# Patient Record
Sex: Male | Born: 1965 | Race: White | Hispanic: No | Marital: Married | Smoking: Never smoker
Health system: Southern US, Community
[De-identification: ages and names within clinical notes are randomized; demographics above are authoritative.]

---

## 2004-07-18 ENCOUNTER — Ambulatory Visit: Payer: Self-pay

## 2004-11-27 ENCOUNTER — Ambulatory Visit: Payer: Self-pay

## 2005-05-20 IMAGING — NM NUCLEAR MEDICINE WHOLE BODY BONE SCINTIGRAPHY
2 series · 8 of 8 positions shown · non-contrast
Comparison: none

REASON FOR EXAM: joint pain  Dr. Luky Iliya [REDACTED]  Yemoh Ablade ...
COMMENTS:

[Series 1: 3 hr wholebody · 2.40mm/px · 2 of 2 frames shown]
[frame 1/2]
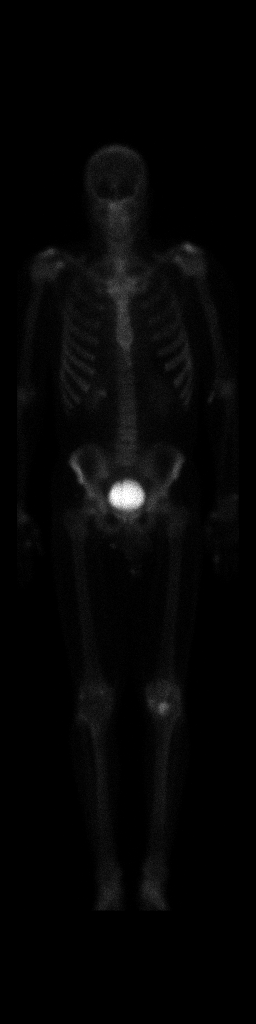
[frame 2/2]
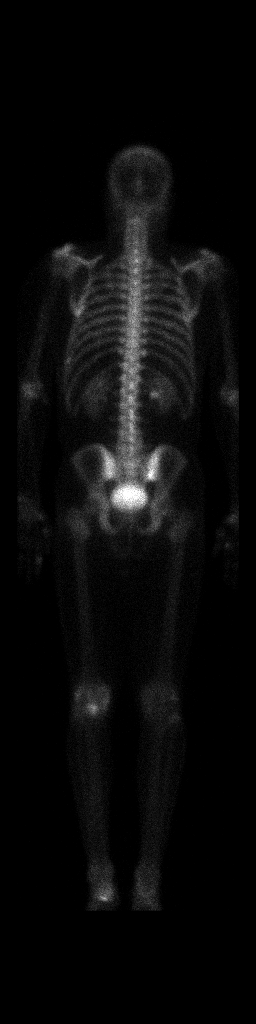

[Series 1: bone statics · 2.40mm/px · 3 acquisitions, 6 frames shown]
[im 1/3]
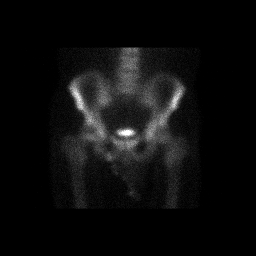
[im 1/3]
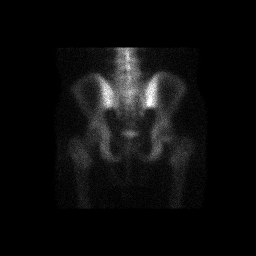
[im 2/3]
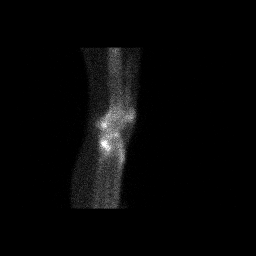
[im 2/3]
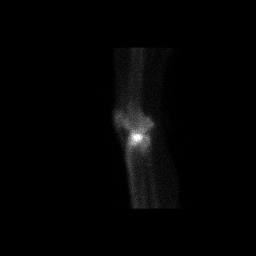
[im 3/3]
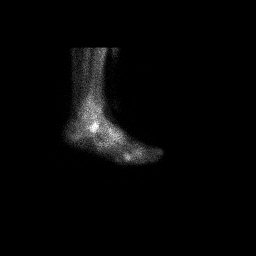
[im 3/3]
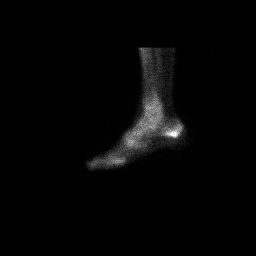

[8 of 8 positions shown; findings below may reference images not displayed]

PROCEDURE:     NM  - NM BONE WB 3 HR [DATE]  [DATE]

RESULT:     The patient was injected with 22.32 mCi of technetium-55m HDP.
Bone scan was performed with anterior and posterior total body views along
with spot views of the pelvis, knees, feet and hands.  There is generalized
increased activity identified within both knees slightly worse on the LEFT
than the RIGHT throughout both feet particularly at the LEFT heel and in
both hands.  The findings of increased activity in the knees and feet are
most likely osteoarthritic in nature though I cannot exclude the possibility
of a stress fracture in the calcaneus.  The activity level in the hands
suggests osteoarthritis as well, but if the patient has a recent history of
trauma this could represent reflect sympathetic dystrophy. I do not see
evidence of suspicious focal intense activity to suspect metastatic disease.
 No suspicious activity is identified within the skull, ribs, vertebral
bodies or pelvis.  The kidneys and bladder appear to be functioning
normally.
IMPRESSION: 1)Diffuse activity is seen throughout both hands. The pattern is suggestive
of osteoarthritis, but if the patient has had recent history of trauma I
cannot exclude the possibility of reflex sympathetic dystrophy.

2)Increased activity in the LEFT knee suggesting arthritis though I again I
cannot exclude the possibility of insufficiency or stress fracture.

3)Diffuse increased activity in both feet that also suggests arthritis
though there is intense activity in the LEFT heel and this may represent an
insufficiency fracture.

4)No suspicious abnormal activity is noted within the skull, ribs, or
vertebral bodies.

## 2016-05-21 ENCOUNTER — Ambulatory Visit
Admission: EM | Admit: 2016-05-21 | Discharge: 2016-05-21 | Disposition: A | Discharge status: Home / Self Care | Payer: BLUE CROSS/BLUE SHIELD | Attending: Family Medicine | Admitting: Family Medicine

## 2016-05-21 DIAGNOSIS — B029 Zoster without complications: Secondary | ICD-10-CM | POA: Diagnosis not present

## 2016-05-21 MED ORDER — VALACYCLOVIR HCL 1 G PO TABS: 1000.0000 mg | ORAL_TABLET | Freq: Three times a day (TID) | ORAL | 0 refills | Status: AC

## 2016-05-21 NOTE — ED Triage Notes (Signed)
Pt c/o rash on his right under arm and back. Not painful and not itchy it appeared on Saturday.

## 2016-05-21 NOTE — ED Provider Notes (Signed)
[facility]    CSN: [id number] Arrival date & time: 05/21/16  1618     History   Chief Complaint Chief Complaint  Patient presents with  . Rash    HPI Brian Mendoza is a 50 y.o. male.   50 yo male with a c/o rash on chest, back and arm for the past 4 days. States rash appeared suddenly and is associated with "tingling" and mild discomfort. Denies any fevers, chills, shortness of breath,  drainage, plant, chemical  or animal contact, medications.   The history is provided by the patient.  Rash    History reviewed. No pertinent past medical history.  There are no active problems to display for this patient.   History reviewed. No pertinent surgical history.     Home Medications    Prior to Admission medications   Medication Sig Start Date End Date Taking? Authorizing Provider  valACYclovir (VALTREX) 1000 MG tablet Take 1 tablet (1,000 mg total) by mouth 3 (three) times daily. 05/21/16   Payton Mccallumrlando [place] Beckford, MD    Family History History reviewed. No pertinent family history.  Social History Social History  Substance Use Topics  . Smoking status: Never Smoker  . Smokeless tobacco: Never Used  . Alcohol use No     Allergies   Patient has no known allergies.   Review of Systems Review of Systems  Skin: Positive for rash.     Physical Exam Triage Vital Signs ED Triage Vitals  Enc Vitals Group     BP 05/21/16 1735 (!) 139/95     Pulse Rate 05/21/16 1735 69     Resp 05/21/16 1735 18     Temp 05/21/16 1735 98.2 F (36.8 C)     Temp Source 05/21/16 1735 Oral     SpO2 05/21/16 1735 100 %     Weight 05/21/16 1733 175 lb (79.4 kg)     Height 05/21/16 1733 6\' 1"  (1.854 m)     Head Circumference --      Peak Flow --      Pain Score 05/21/16 1734 0     Pain Loc --      Pain Edu? --      Excl. in GC? --    No data found.   Updated Vital Signs BP (!) 139/95 (BP Location: Left Arm)   Pulse 69   Temp 98.2 F (36.8 C) (Oral)   Resp 18    Ht 6\' 1"  (1.854 m)   Wt 175 lb (79.4 kg)   SpO2 100%   BMI 23.09 kg/m   Visual Acuity Right Eye Distance:   Left Eye Distance:   Bilateral Distance:    Right Eye Near:   Left Eye Near:    Bilateral Near:     Physical Exam  Constitutional: He appears well-developed and well-nourished. No distress.  Skin: He is not diaphoretic.  Erythematous, vesicular rash along dermatomal pattern along anterior and right side chest wall as well as right mid back area; 3 discreet, aligned lesions as well on right inner upper arm  Nursing note and vitals reviewed.    UC Treatments / Results  Labs (all labs ordered are listed, but only abnormal results are displayed) Labs Reviewed - No data to display  EKG  EKG Interpretation None       Radiology No results found.  Procedures Procedures (including critical care time)  Medications Ordered in UC Medications - No data to display   Initial Impression / Assessment  and Plan / UC Course  I have reviewed the triage vital signs and the nursing notes.  Pertinent labs & imaging results that were available during my care of the patient were reviewed by me and considered in my medical decision making (see chart for details).  Clinical Course       Final Clinical Impressions(s) / UC Diagnoses   Final diagnoses:  Herpes zoster without complication    New Prescriptions Discharge Medication List as of 05/21/2016  6:22 PM    START taking these medications   Details  valACYclovir (VALTREX) 1000 MG tablet Take 1 tablet (1,000 mg total) by mouth 3 (three) times daily., Starting Tue 05/21/2016, Normal       1. diagnosis reviewed with patient 2. rx as per orders above; reviewed possible side effects, interactions, risks and benefits  3. Recommend supportive treatment with otc analgesics, prn 4. Follow-up prn if symptoms worsen or don't improve   Payton Mccallumrlando Nekia Maxham, MD 05/21/16 1934

## 2016-05-24 ENCOUNTER — Telehealth: Payer: Self-pay | Admitting: *Deleted

## 2016-05-24 NOTE — Telephone Encounter (Signed)
Courtesy call back, no answer, left message on answering to call back if any questions of concerns should arise.
